# Patient Record
Sex: Male | Born: 2000 | Race: White | Hispanic: No | Marital: Single | Smoking: Never smoker
Health system: Southern US, Community
[De-identification: ages and names within clinical notes are randomized; demographics above are authoritative.]

## PROBLEM LIST (undated history)

## (undated) DIAGNOSIS — J45909 Unspecified asthma, uncomplicated: Secondary | ICD-10-CM

---
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
## 2020-01-08 DIAGNOSIS — S134XXA Sprain of ligaments of cervical spine, initial encounter: Secondary | ICD-10-CM | POA: Diagnosis not present

## 2020-01-08 DIAGNOSIS — S233XXA Sprain of ligaments of thoracic spine, initial encounter: Secondary | ICD-10-CM | POA: Diagnosis not present

## 2020-01-22 ENCOUNTER — Ambulatory Visit
Admission: EM | Admit: 2020-01-22 | Discharge: 2020-01-22 | Disposition: A | Discharge status: Home / Self Care | Payer: BC Managed Care – PPO | Attending: Emergency Medicine | Admitting: Emergency Medicine

## 2020-01-22 ENCOUNTER — Other Ambulatory Visit: Payer: Self-pay

## 2020-01-22 DIAGNOSIS — S233XXA Sprain of ligaments of thoracic spine, initial encounter: Secondary | ICD-10-CM | POA: Diagnosis not present

## 2020-01-22 DIAGNOSIS — Z1152 Encounter for screening for COVID-19: Secondary | ICD-10-CM

## 2020-01-22 DIAGNOSIS — S134XXA Sprain of ligaments of cervical spine, initial encounter: Secondary | ICD-10-CM | POA: Diagnosis not present

## 2020-01-23 LAB — SARS-COV-2, NAA 2 DAY TAT

## 2020-01-23 LAB — NOVEL CORONAVIRUS, NAA: SARS-CoV-2, NAA: NOT DETECTED

## 2020-02-07 ENCOUNTER — Other Ambulatory Visit: Payer: Self-pay

## 2020-02-07 ENCOUNTER — Ambulatory Visit
Admission: EM | Admit: 2020-02-07 | Discharge: 2020-02-07 | Disposition: A | Discharge status: Home / Self Care | Payer: BC Managed Care – PPO | Attending: Family Medicine | Admitting: Family Medicine

## 2020-02-07 DIAGNOSIS — Z20822 Contact with and (suspected) exposure to covid-19: Secondary | ICD-10-CM

## 2020-02-07 NOTE — ED Triage Notes (Signed)
Pt presents for covid test for school. Denies any symptoms.

## 2020-02-11 LAB — NOVEL CORONAVIRUS, NAA: SARS-CoV-2, NAA: NOT DETECTED

## 2021-01-04 ENCOUNTER — Other Ambulatory Visit: Payer: Self-pay

## 2021-01-04 ENCOUNTER — Encounter (HOSPITAL_COMMUNITY): Payer: Self-pay | Admitting: Student

## 2021-01-04 ENCOUNTER — Emergency Department (HOSPITAL_COMMUNITY): Payer: Self-pay

## 2021-01-04 ENCOUNTER — Emergency Department (HOSPITAL_COMMUNITY)
Admission: EM | Admit: 2021-01-04 | Discharge: 2021-01-05 | Disposition: A | Discharge status: Home / Self Care | Payer: Self-pay | Attending: Emergency Medicine | Admitting: Emergency Medicine

## 2021-01-04 DIAGNOSIS — J45909 Unspecified asthma, uncomplicated: Secondary | ICD-10-CM | POA: Insufficient documentation

## 2021-01-04 DIAGNOSIS — W01190A Fall on same level from slipping, tripping and stumbling with subsequent striking against furniture, initial encounter: Secondary | ICD-10-CM | POA: Insufficient documentation

## 2021-01-04 DIAGNOSIS — Z79899 Other long term (current) drug therapy: Secondary | ICD-10-CM | POA: Insufficient documentation

## 2021-01-04 DIAGNOSIS — Y900 Blood alcohol level of less than 20 mg/100 ml: Secondary | ICD-10-CM | POA: Insufficient documentation

## 2021-01-04 DIAGNOSIS — S0083XA Contusion of other part of head, initial encounter: Secondary | ICD-10-CM | POA: Insufficient documentation

## 2021-01-04 DIAGNOSIS — R55 Syncope and collapse: Secondary | ICD-10-CM | POA: Insufficient documentation

## 2021-01-04 HISTORY — DX: Unspecified asthma, uncomplicated: J45.909

## 2021-01-04 LAB — CBC WITH DIFFERENTIAL/PLATELET
Abs Immature Granulocytes: 0.16 10*3/uL — ABNORMAL HIGH (ref 0.00–0.07)
Basophils Absolute: 0.1 10*3/uL (ref 0.0–0.1)
Basophils Relative: 0 %
Eosinophils Absolute: 0.1 10*3/uL (ref 0.0–0.5)
Eosinophils Relative: 0 %
HCT: 42.5 % (ref 39.0–52.0)
Hemoglobin: 14.6 g/dL (ref 13.0–17.0)
Immature Granulocytes: 1 %
Lymphocytes Relative: 15 %
Lymphs Abs: 2.8 10*3/uL (ref 0.7–4.0)
MCH: 29.9 pg (ref 26.0–34.0)
MCHC: 34.4 g/dL (ref 30.0–36.0)
MCV: 86.9 fL (ref 80.0–100.0)
Monocytes Absolute: 1 10*3/uL (ref 0.1–1.0)
Monocytes Relative: 5 %
Neutro Abs: 14.1 10*3/uL — ABNORMAL HIGH (ref 1.7–7.7)
Neutrophils Relative %: 79 %
Platelets: 410 10*3/uL — ABNORMAL HIGH (ref 150–400)
RBC: 4.89 MIL/uL (ref 4.22–5.81)
RDW: 12.6 % (ref 11.5–15.5)
WBC: 18.1 10*3/uL — ABNORMAL HIGH (ref 4.0–10.5)
nRBC: 0 % (ref 0.0–0.2)

## 2021-01-04 LAB — ETHANOL: Alcohol, Ethyl (B): <10 mg/dL (ref <10)

## 2021-01-04 NOTE — ED Triage Notes (Addendum)
Pt presents to ED POV. Pt c/o fall. Pt reports unsure LOC, hit head injury. Reports emesis afterwards. Pt reports that he was smoking marijuana and possible ETOH.
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
## 2021-01-05 LAB — COMPREHENSIVE METABOLIC PANEL
ALT: 19 U/L (ref 0–44)
AST: 21 U/L (ref 15–41)
Albumin: 4.3 g/dL (ref 3.5–5.0)
Alkaline Phosphatase: 55 U/L (ref 38–126)
Anion gap: 14 (ref 5–15)
BUN: 10 mg/dL (ref 6–20)
CO2: 22 mmol/L (ref 22–32)
Calcium: 9.6 mg/dL (ref 8.9–10.3)
Chloride: 102 mmol/L (ref 98–111)
Creatinine, Ser: 0.89 mg/dL (ref 0.61–1.24)
GFR, Estimated: >60 mL/min (ref >60)
Glucose, Bld: 190 mg/dL — ABNORMAL HIGH (ref 70–99)
Potassium: 3.1 mmol/L — ABNORMAL LOW (ref 3.5–5.1)
Sodium: 138 mmol/L (ref 135–145)
Total Bilirubin: 0.7 mg/dL (ref 0.3–1.2)
Total Protein: 7 g/dL (ref 6.5–8.1)

## 2021-01-05 LAB — LIPASE, BLOOD: Lipase: 26 U/L (ref 11–51)

## 2021-01-05 MED ORDER — ONDANSETRON HCL 4 MG PO TABS: 4.0000 mg | ORAL_TABLET | Freq: Four times a day (QID) | ORAL | 0 refills | Status: AC
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
## 2021-01-05 NOTE — Discharge Instructions (Signed)
You may have a concussion-- can have some persistent headache and nausea for a few days. Make sure to get adequate rest, avoid street drugs and alcohol. Limit TV, computer, phone, tablet use as this can cause eye strain and worsen headaches. Return here for any new/acute changes.

## 2022-05-30 IMAGING — CT CT HEAD W/O CM
4 series · 16 of 47 positions shown, 18 images · non-contrast
Comparison: CT brain and cervical spine 12/08/2002

CLINICAL DATA: Emesis fall

EXAM:
CT HEAD WITHOUT CONTRAST
CT CERVICAL SPINE WITHOUT CONTRAST
TECHNIQUE: Multidetector CT imaging of the head and cervical spine was
performed following the standard protocol without intravenous
contrast. Multiplanar CT image reconstructions of the cervical spine
were also generated.

[Series 3: head wo · axial · 0.46mm/px · z∈[-130,-6]mm · 7 of 35 slices shown, 9 images]
[im 5/35  brain]
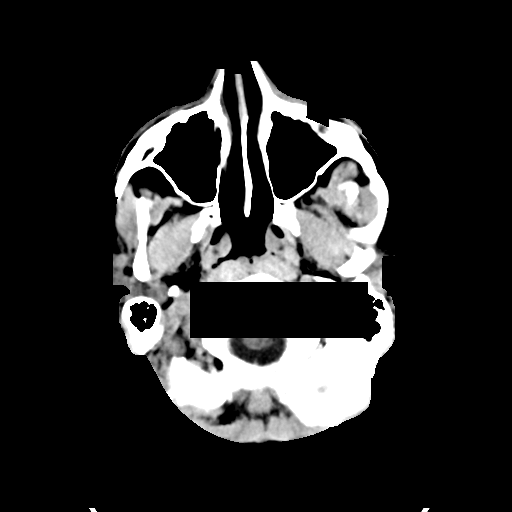
[im 5/35  bone]
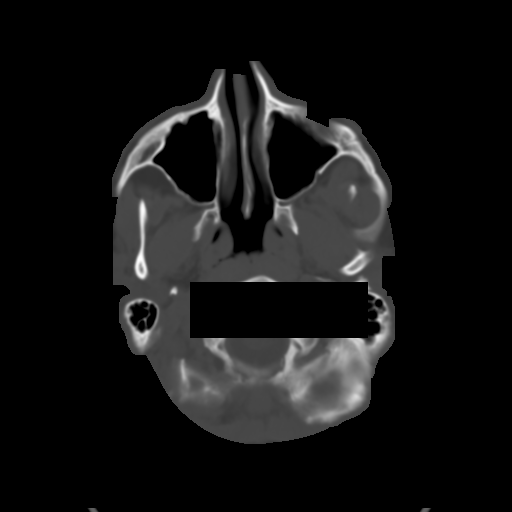
[im 9/35  brain]
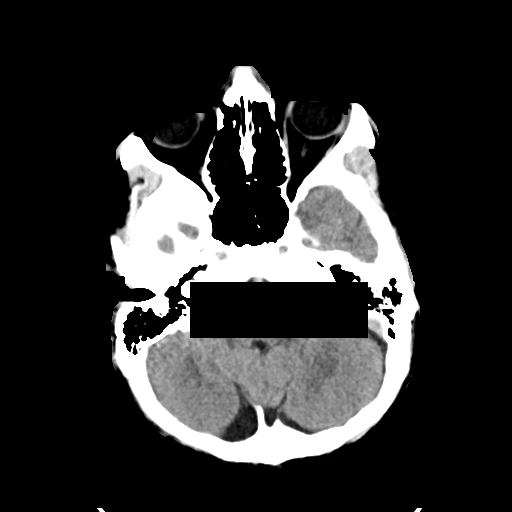
[im 13/35  brain]
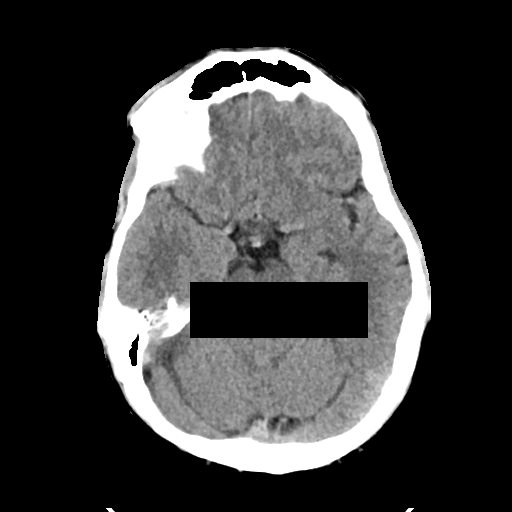
[im 18/35  brain]
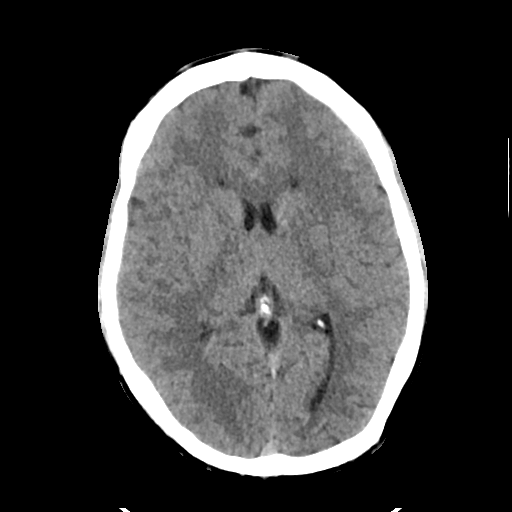
[im 22/35  brain]
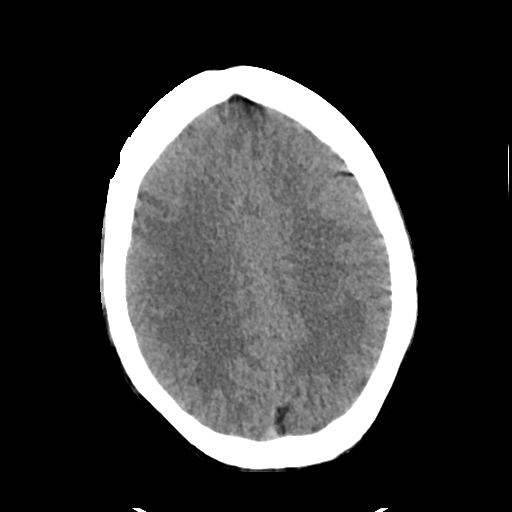
[im 22/35  bone]
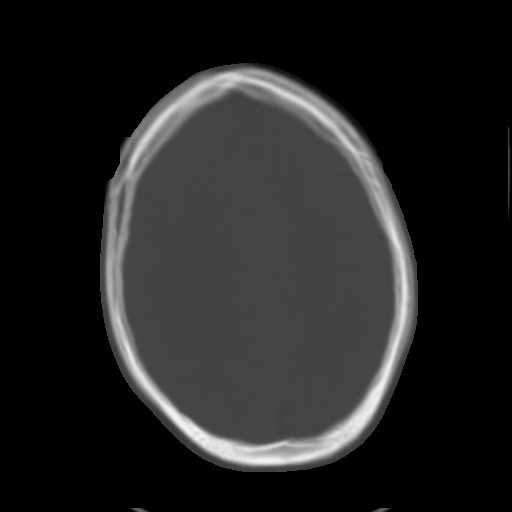
[im 26/35  brain]
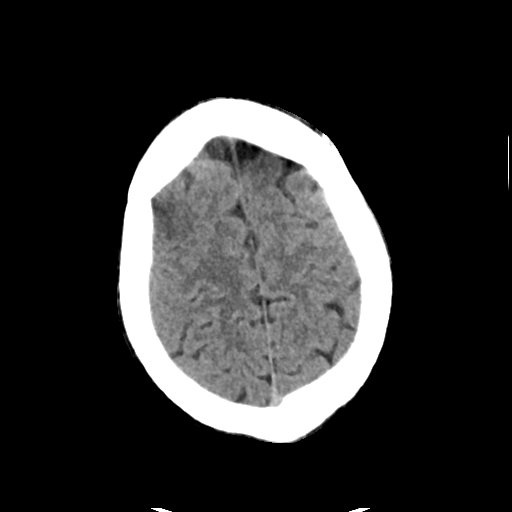
[im 30/35  brain]
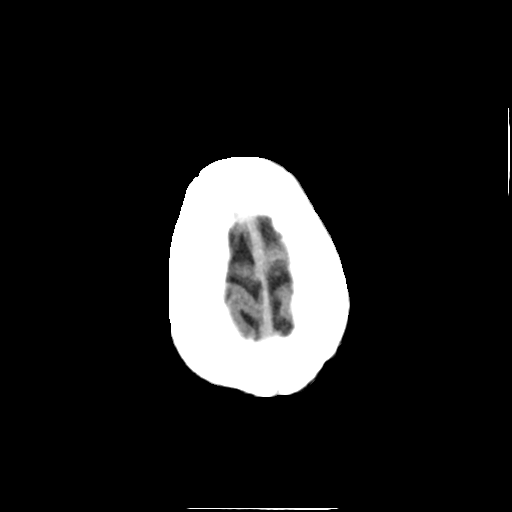

[Series 4: head bone · axial · 0.46mm/px · z∈[-134,-100]mm · 3 of 87 slices shown]
[im 9/87  bone]
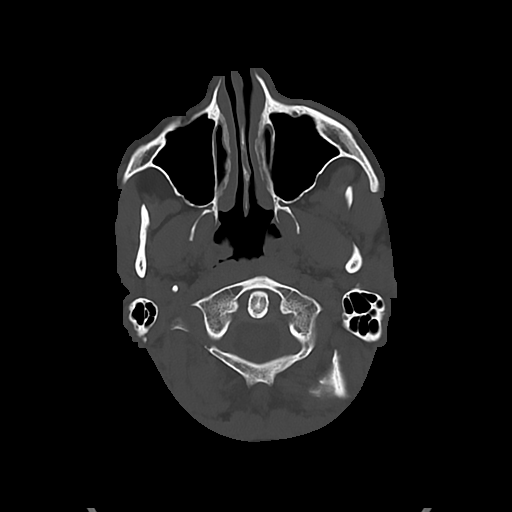
[im 18/87  bone]
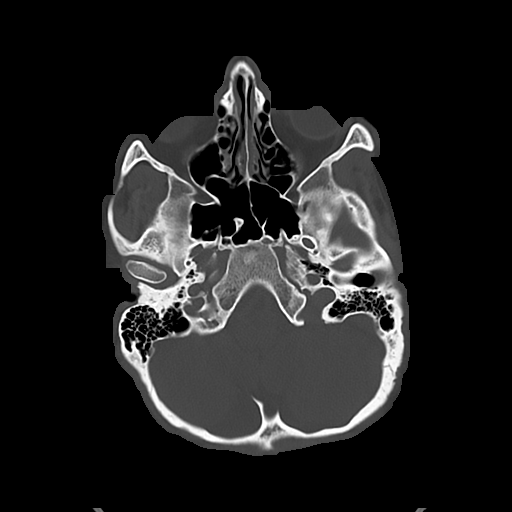
[im 26/87  bone]
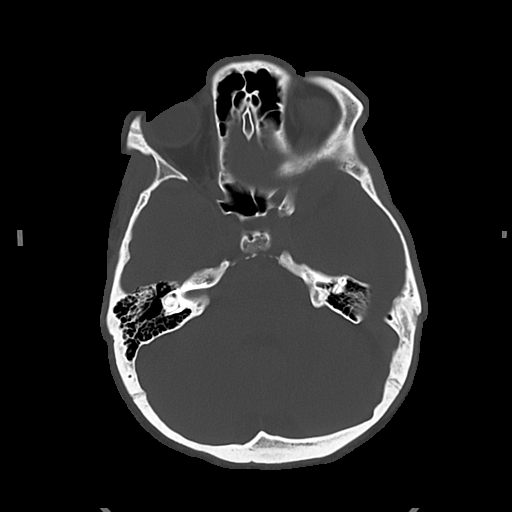

[Series 5: cor soft · coronal · 0.37mm/px · 3 of 75 slices shown]
[im 25/75  brain]
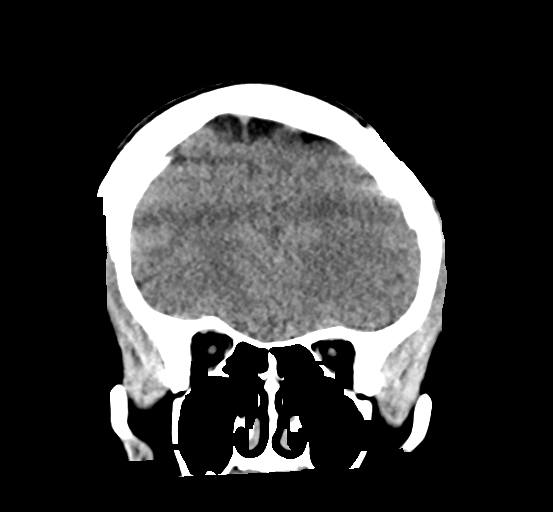
[im 33/75  brain]
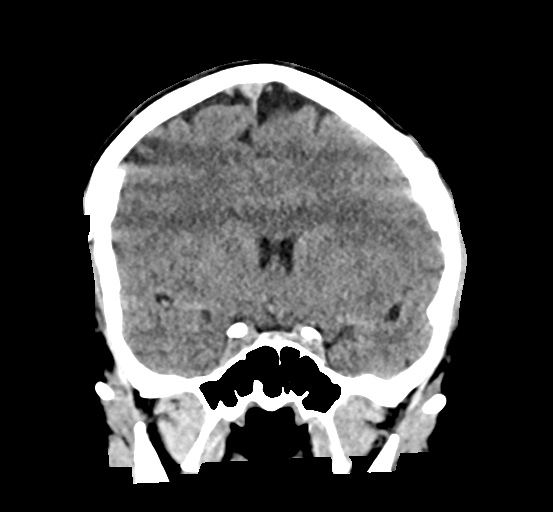
[im 42/75  brain]
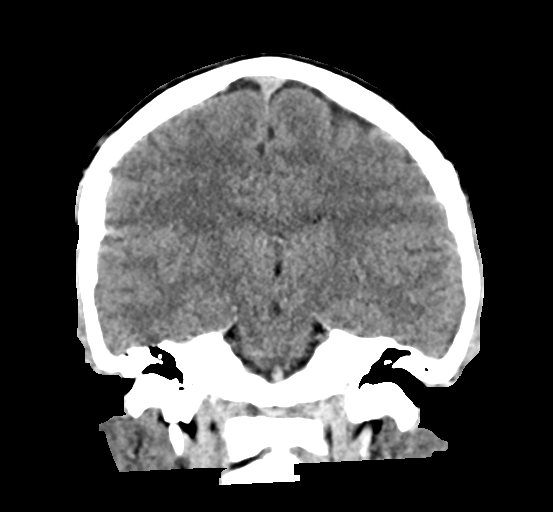

[Series 6: sag soft · sagittal · 0.39mm/px · 3 of 58 slices shown]
[im 20/58  brain]
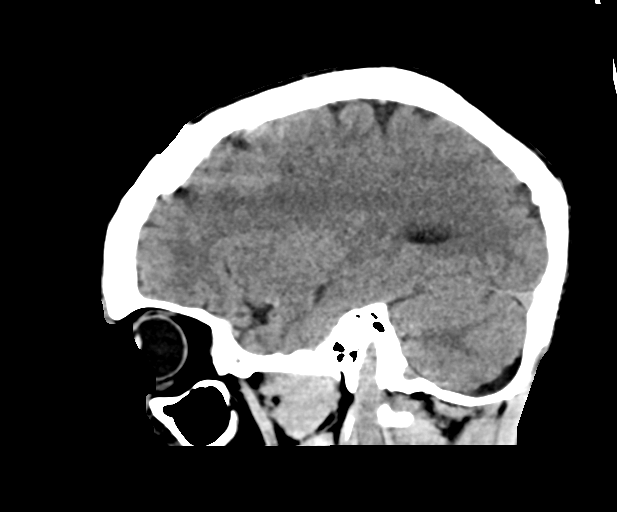
[im 29/58  brain]
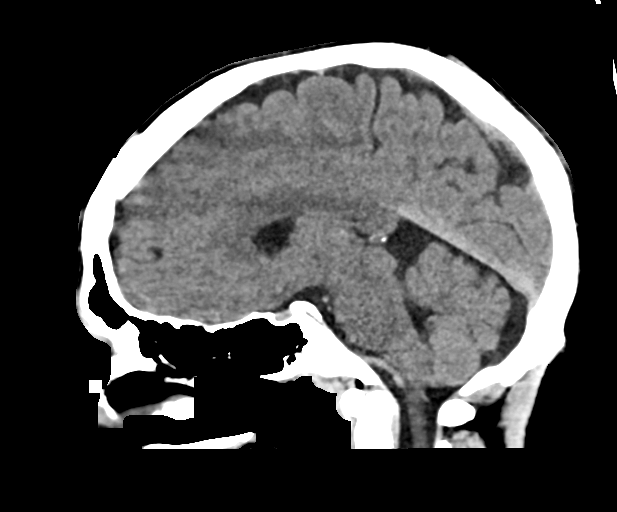
[im 39/58  brain]
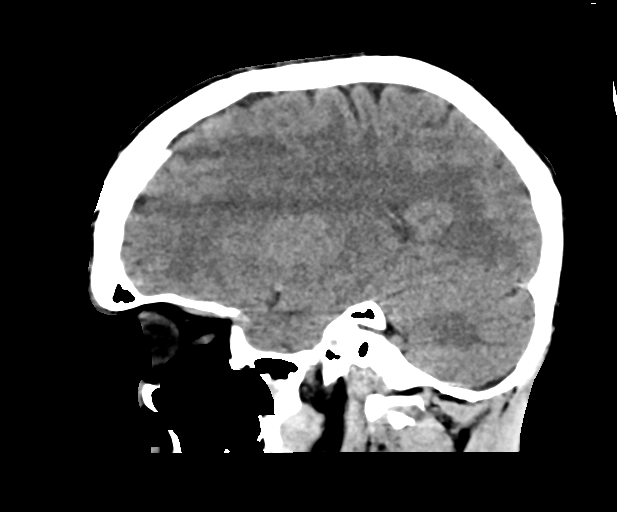

[16 of 47 positions shown; findings below may reference images not displayed]

FINDINGS: CT HEAD FINDINGS

Brain: Mild motion degradation. No acute territorial infarction,
hemorrhage or intracranial mass. The ventricles are nonenlarged.

Vascular: No hyperdense vessel or unexpected calcification.

Skull: Normal. Negative for fracture or focal lesion.

Sinuses/Orbits: No acute finding.

Other: None

CT CERVICAL SPINE FINDINGS

Alignment: Straightening of the cervical spine. No subluxation.
Facet alignment is normal

Skull base and vertebrae: No acute fracture. No primary bone lesion
or focal pathologic process.

Soft tissues and spinal canal: No prevertebral fluid or swelling. No
visible canal hematoma.

Disc levels:  Within normal limits

Upper chest: Negative.

Other: None
IMPRESSION: 1. Negative non contrasted CT appearance of the brain.
2. Straightening of the cervical spine. No acute osseous abnormality

## 2022-05-30 IMAGING — CT CT CERVICAL SPINE W/O CM
3 of 4 series · 12 of 33 positions shown, 14 images · non-contrast
Comparison: CT brain and cervical spine 12/08/2002

CLINICAL DATA: Emesis fall

EXAM:
CT HEAD WITHOUT CONTRAST
CT CERVICAL SPINE WITHOUT CONTRAST
TECHNIQUE: Multidetector CT imaging of the head and cervical spine was
performed following the standard protocol without intravenous
contrast. Multiplanar CT image reconstructions of the cervical spine
were also generated.

[Series 8: sag bone · sagittal · 0.32mm/px · 5 of 170 slices shown, 6 images]
[im 57/170  bone]
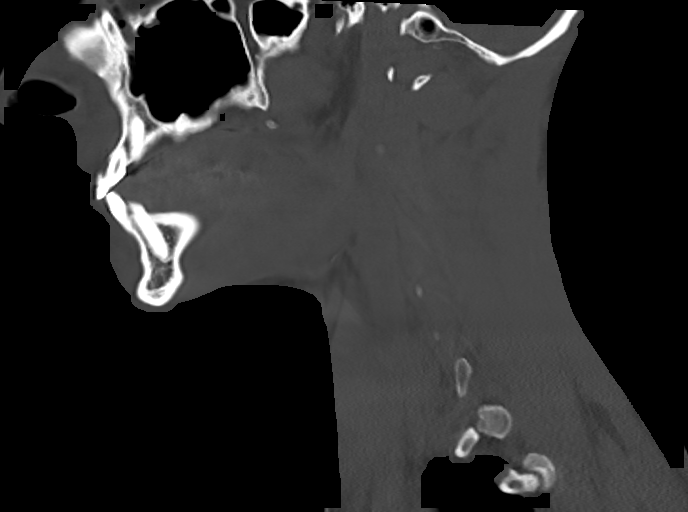
[im 71/170  bone]
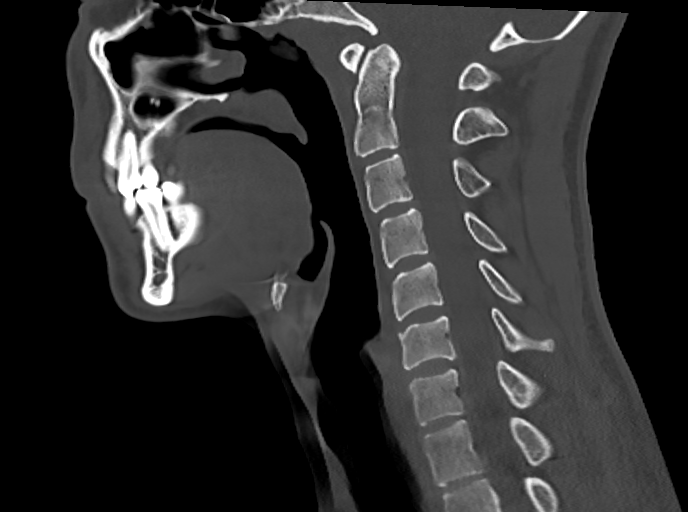
[im 85/170  soft-tissue]
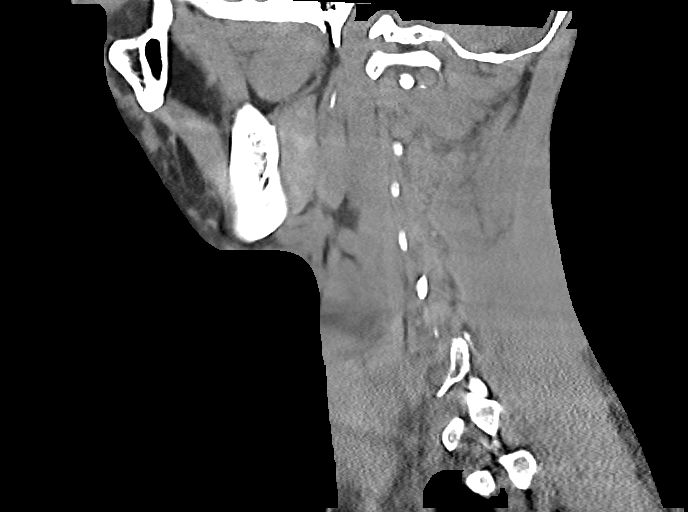
[im 85/170  bone]
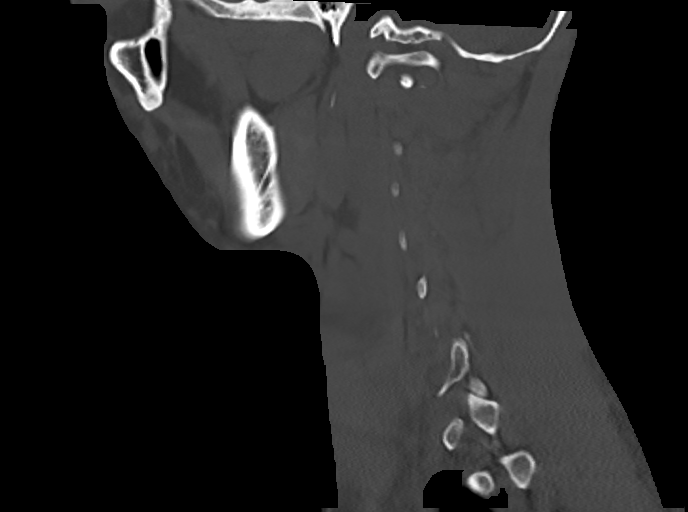
[im 99/170  bone]
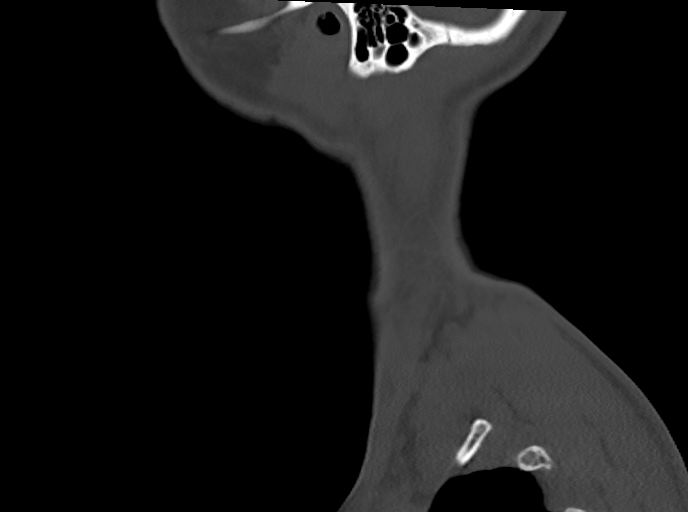
[im 113/170  bone]
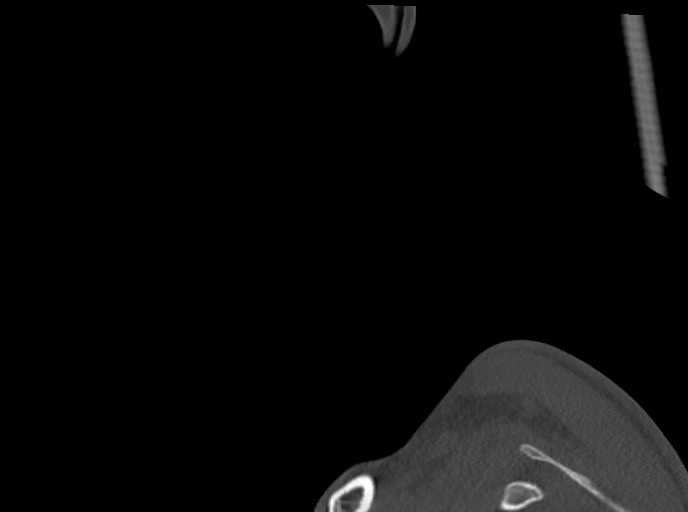

[Series 9: cor bone · coronal · 0.32mm/px · 3 of 110 slices shown]
[im 22/110  bone]
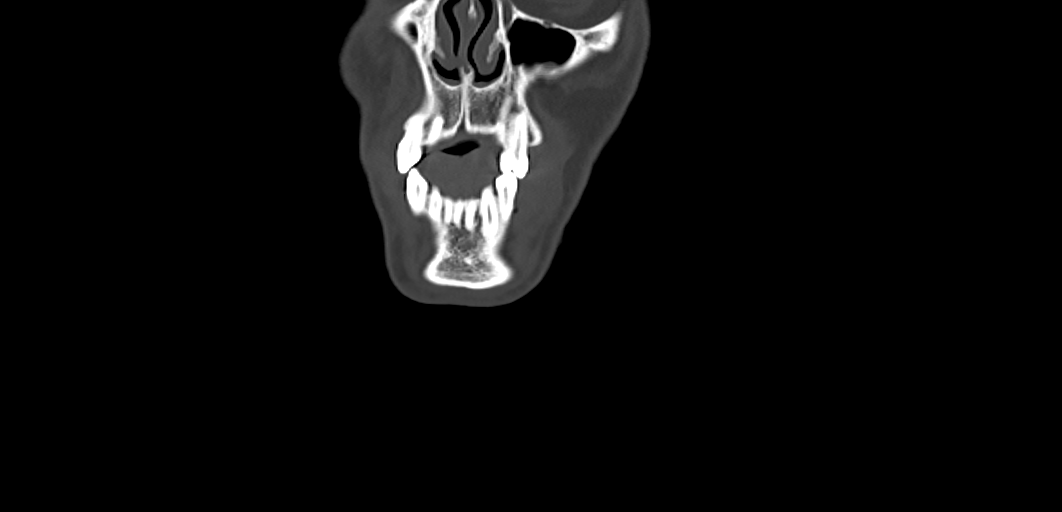
[im 44/110  bone]
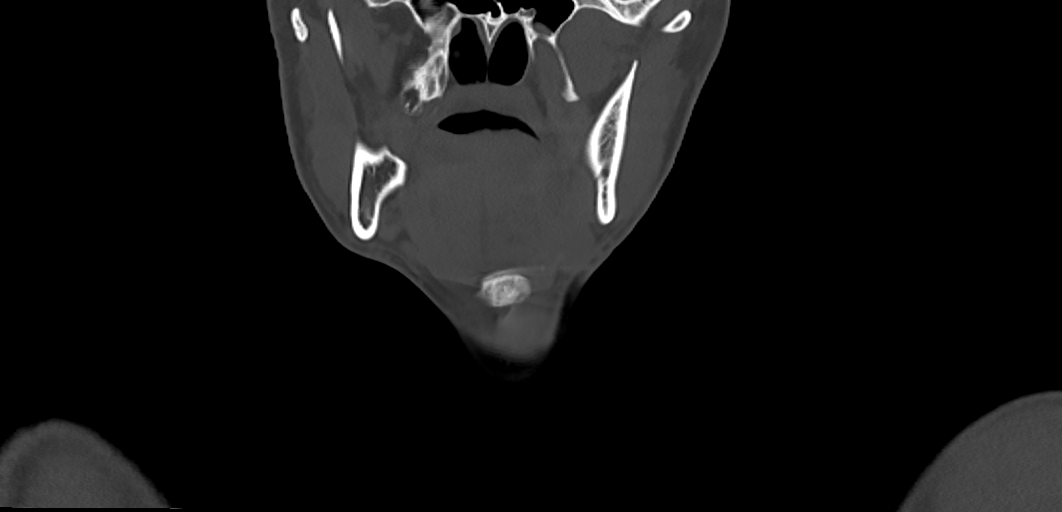
[im 66/110  bone]
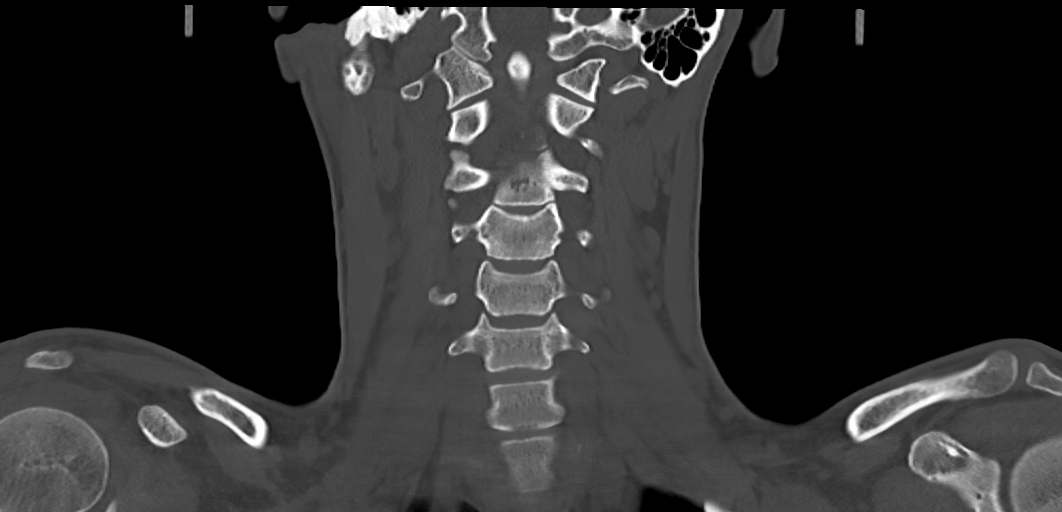

[Series 10: orthogonal axials · axial · 0.21mm/px · z∈[-267,-146]mm · 4 of 89 slices shown, 5 images]
[im 15/89  soft-tissue]
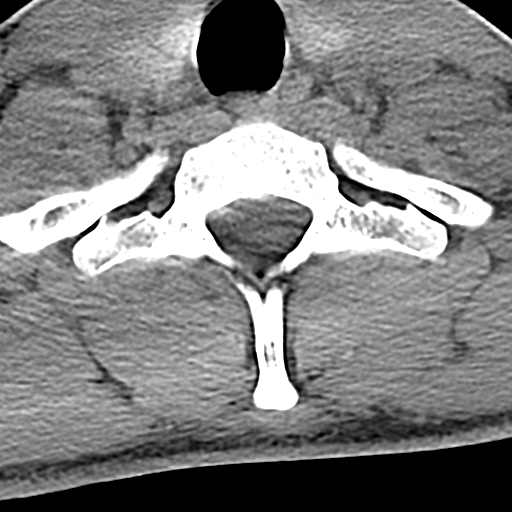
[im 15/89  bone]
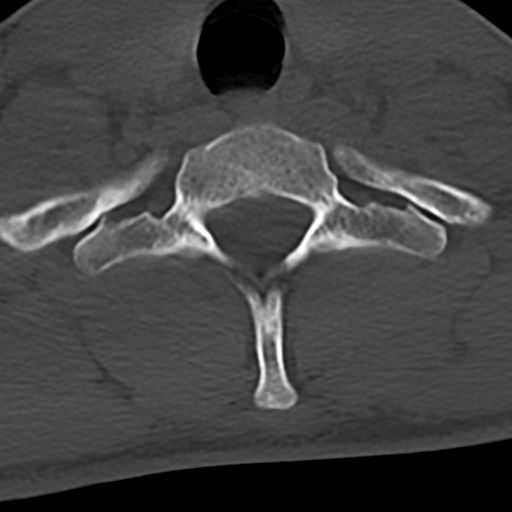
[im 30/89  bone]
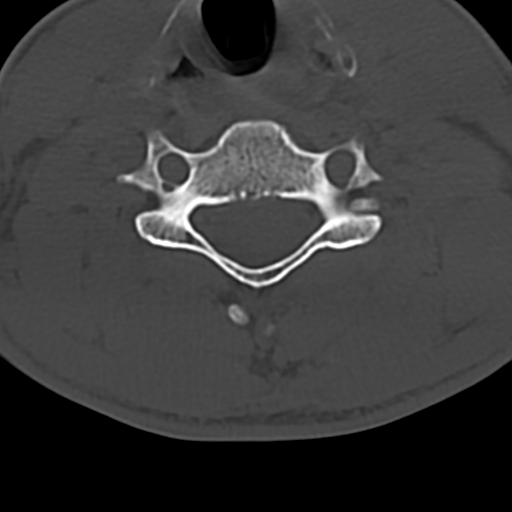
[im 59/89  bone]
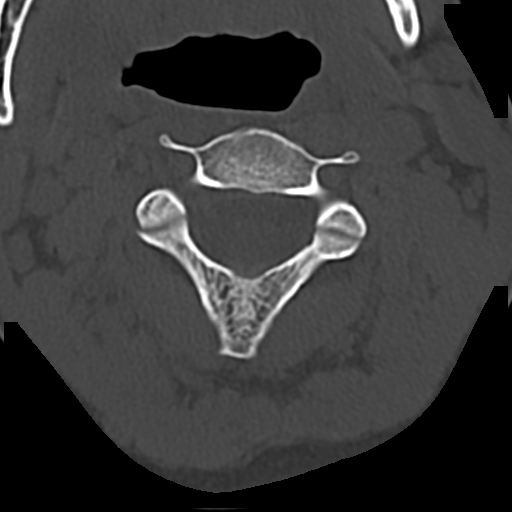
[im 74/89  bone]
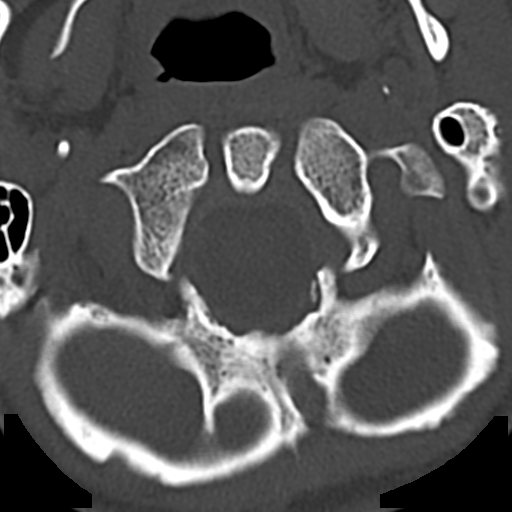

[12 of 33 positions shown; findings below may reference images not displayed]

FINDINGS: CT HEAD FINDINGS

Brain: Mild motion degradation. No acute territorial infarction,
hemorrhage or intracranial mass. The ventricles are nonenlarged.

Vascular: No hyperdense vessel or unexpected calcification.

Skull: Normal. Negative for fracture or focal lesion.

Sinuses/Orbits: No acute finding.

Other: None

CT CERVICAL SPINE FINDINGS

Alignment: Straightening of the cervical spine. No subluxation.
Facet alignment is normal

Skull base and vertebrae: No acute fracture. No primary bone lesion
or focal pathologic process.

Soft tissues and spinal canal: No prevertebral fluid or swelling. No
visible canal hematoma.

Disc levels:  Within normal limits

Upper chest: Negative.

Other: None
IMPRESSION: 1. Negative non contrasted CT appearance of the brain.
2. Straightening of the cervical spine. No acute osseous abnormality
